# Patient Record
Sex: Female | Born: 1990 | Race: White | Hispanic: No | Marital: Single | Smoking: Never smoker
Health system: Southern US, Community
[De-identification: ages and names within clinical notes are randomized; demographics above are authoritative.]

## PROBLEM LIST (undated history)

## (undated) DIAGNOSIS — M8430XA Stress fracture, unspecified site, initial encounter for fracture: Secondary | ICD-10-CM

## (undated) DIAGNOSIS — T7840XA Allergy, unspecified, initial encounter: Secondary | ICD-10-CM

## (undated) HISTORY — DX: Stress fracture, unspecified site, initial encounter for fracture: M84.30XA

## (undated) HISTORY — DX: Allergy, unspecified, initial encounter: T78.40XA

---
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
## 2011-01-07 ENCOUNTER — Encounter: Payer: Self-pay | Admitting: Internal Medicine

## 2011-01-08 ENCOUNTER — Ambulatory Visit (INDEPENDENT_AMBULATORY_CARE_PROVIDER_SITE_OTHER): Payer: Self-pay | Admitting: Internal Medicine

## 2011-01-08 ENCOUNTER — Encounter: Payer: Self-pay | Admitting: Internal Medicine

## 2011-01-08 VITALS — BP 120/80 | HR 72 | Temp 98.5°F | Wt 136.0 lb

## 2011-01-08 DIAGNOSIS — R0609 Other forms of dyspnea: Secondary | ICD-10-CM

## 2011-01-08 DIAGNOSIS — J45990 Exercise induced bronchospasm: Secondary | ICD-10-CM

## 2011-01-08 DIAGNOSIS — J3089 Other allergic rhinitis: Secondary | ICD-10-CM

## 2011-01-08 DIAGNOSIS — R0600 Dyspnea, unspecified: Secondary | ICD-10-CM

## 2011-01-08 MED ORDER — BECLOMETHASONE DIPROPIONATE 40 MCG/ACT IN AERS: 2.0000 | INHALATION_SPRAY | Freq: Two times a day (BID) | RESPIRATORY_TRACT | Status: DC

## 2011-01-08 NOTE — Patient Instructions (Signed)
Begin q var as discussed  Call in meantime if needed. Will contact you about.  About pulmonary consult. Get chest x ray and will let you know results

## 2011-01-08 NOTE — Progress Notes (Signed)
  Subjective:    Patient ID: Shannon Powers, female    DOB: November 20, 1990, 20 y.o.   MRN: [id number]  HPI Comes in before going off to college for above symptoms . Increasing sx since June ; used inhaler and now more constant.  Is living in [place] . Sometimes light headed after  Exercise breathing  . No cough Hx of allergy Sx around cats.  Eyes red and runny nose and wheeze.  And also the fall. Hx of nebs as a young child. No hx of pneumonia .  hosp for asthma. Currently she describes sx as "Hard to take deep breaths ."   Harder with  Increasing exercising.  No tobacco and no ets.  No nocturnal sx. [place] stuffy nose  Not on meds.   Has to go back to [employer]    Review of Systems No fever vision changes edema GI GU issues new ortho problems no chest pain.  No longer on any OCPS  Or reg allergy meds just albuterol pre exercise  Past Medical History  Diagnosis Date  . Premature baby     [redacted] weeks gestation  . Asthma   . Stress fracture 2009    [place]    No past surgical history on file.  reports that she has never smoked. She does not have any smokeless tobacco history on file. She reports that she drinks alcohol. She reports that she does not use illicit drugs. family history is not on file. No Known Allergies     Objective:   Physical Exam Physical Exam: Vital signs reviewed NWG:NFAO is a well-developed well-nourished alert cooperative  white female who appears her stated age in no acute distress.  HEENT: normocephalic  traumatic , Eyes: PERRL EOM's full, conjunctiva clear, Nares: paten,t no deformity discharge or tenderness., Ears: no deformity EAC's clear TMs with normal landmarks. Mouth: clear OP, no lesions, edema.  Moist mucous membranes. Dentition in adequate repair. NECK: supple without masses, thyromegaly or bruits. CHEST/PULM:  Clear to auscultation and percussion breath sounds equal no wheeze , rales or rhonchi. No chest wall deformities  or tenderness. CV: PMI is nondisplaced, S1 S2 no gallops, murmurs, rubs. Peripheral pulses are full without delay.No JVD .  ABDOMEN: Bowel sounds normal nontender  No guard or rebound, no hepato splenomegal no CVA tenderness.   Extremtities:  No clubbing cyanosis or edema, no acute joint swelling or redness no focal atrophy NEURO:  Oriented x3, cranial nerves 3-12 appear to be intact, no obvious focal weakness,gait within normal limits no abnormal reflexes or asymmetrical SKIN: No acute rashes normal turgor, color, no bruising or petechiae. PSYCH: Oriented, good eye contact, no obvious depression anxiety, cognition and judgment appear normal.     Assessment & Plan:  Dyspnea   Worse with exertion  Hx of eia and allergic sx   No evidence of CV issue. 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   Off to school this week but can come back Get c xray add controller inhaler ( samples of Q Var 40) as discussed and prob need spirometry and eval . To expedite eval  Will  Arrange pulm consult.

## 2011-01-09 ENCOUNTER — Ambulatory Visit (INDEPENDENT_AMBULATORY_CARE_PROVIDER_SITE_OTHER)
Admission: RE | Admit: 2011-01-09 | Discharge: 2011-01-09 | Disposition: A | Discharge status: Home / Self Care | Payer: Self-pay | Source: Ambulatory Visit | Attending: Internal Medicine | Admitting: Internal Medicine

## 2011-01-09 DIAGNOSIS — R0600 Dyspnea, unspecified: Secondary | ICD-10-CM

## 2011-01-09 DIAGNOSIS — R0609 Other forms of dyspnea: Secondary | ICD-10-CM

## 2011-01-09 DIAGNOSIS — J45990 Exercise induced bronchospasm: Secondary | ICD-10-CM

## 2011-01-09 DIAGNOSIS — R0989 Other specified symptoms and signs involving the circulatory and respiratory systems: Secondary | ICD-10-CM

## 2011-01-10 ENCOUNTER — Telehealth: Payer: Self-pay | Admitting: *Deleted

## 2011-01-10 NOTE — Telephone Encounter (Signed)
Message copied by Romualdo Bolk on Thu Jan 10, 2011  9:47 AM ------      Message from: [facility] [place], [place] K      Created: Wed Jan 09, 2011  6:51 PM       Tell patient x ray is normal.

## 2011-01-10 NOTE — Telephone Encounter (Signed)
Left message to call back  

## 2011-01-13 ENCOUNTER — Encounter: Payer: Self-pay | Admitting: Internal Medicine

## 2011-01-14 NOTE — Telephone Encounter (Signed)
Pts mom aware of results

## 2012-06-19 IMAGING — CR DG CHEST 2V
2 series · 2 of 2 positions shown · non-contrast
Comparison: None.

CLINICAL DATA: Shortness of breath with asthma.

CHEST - 2 VIEW

[view not recorded (1 of 2)]
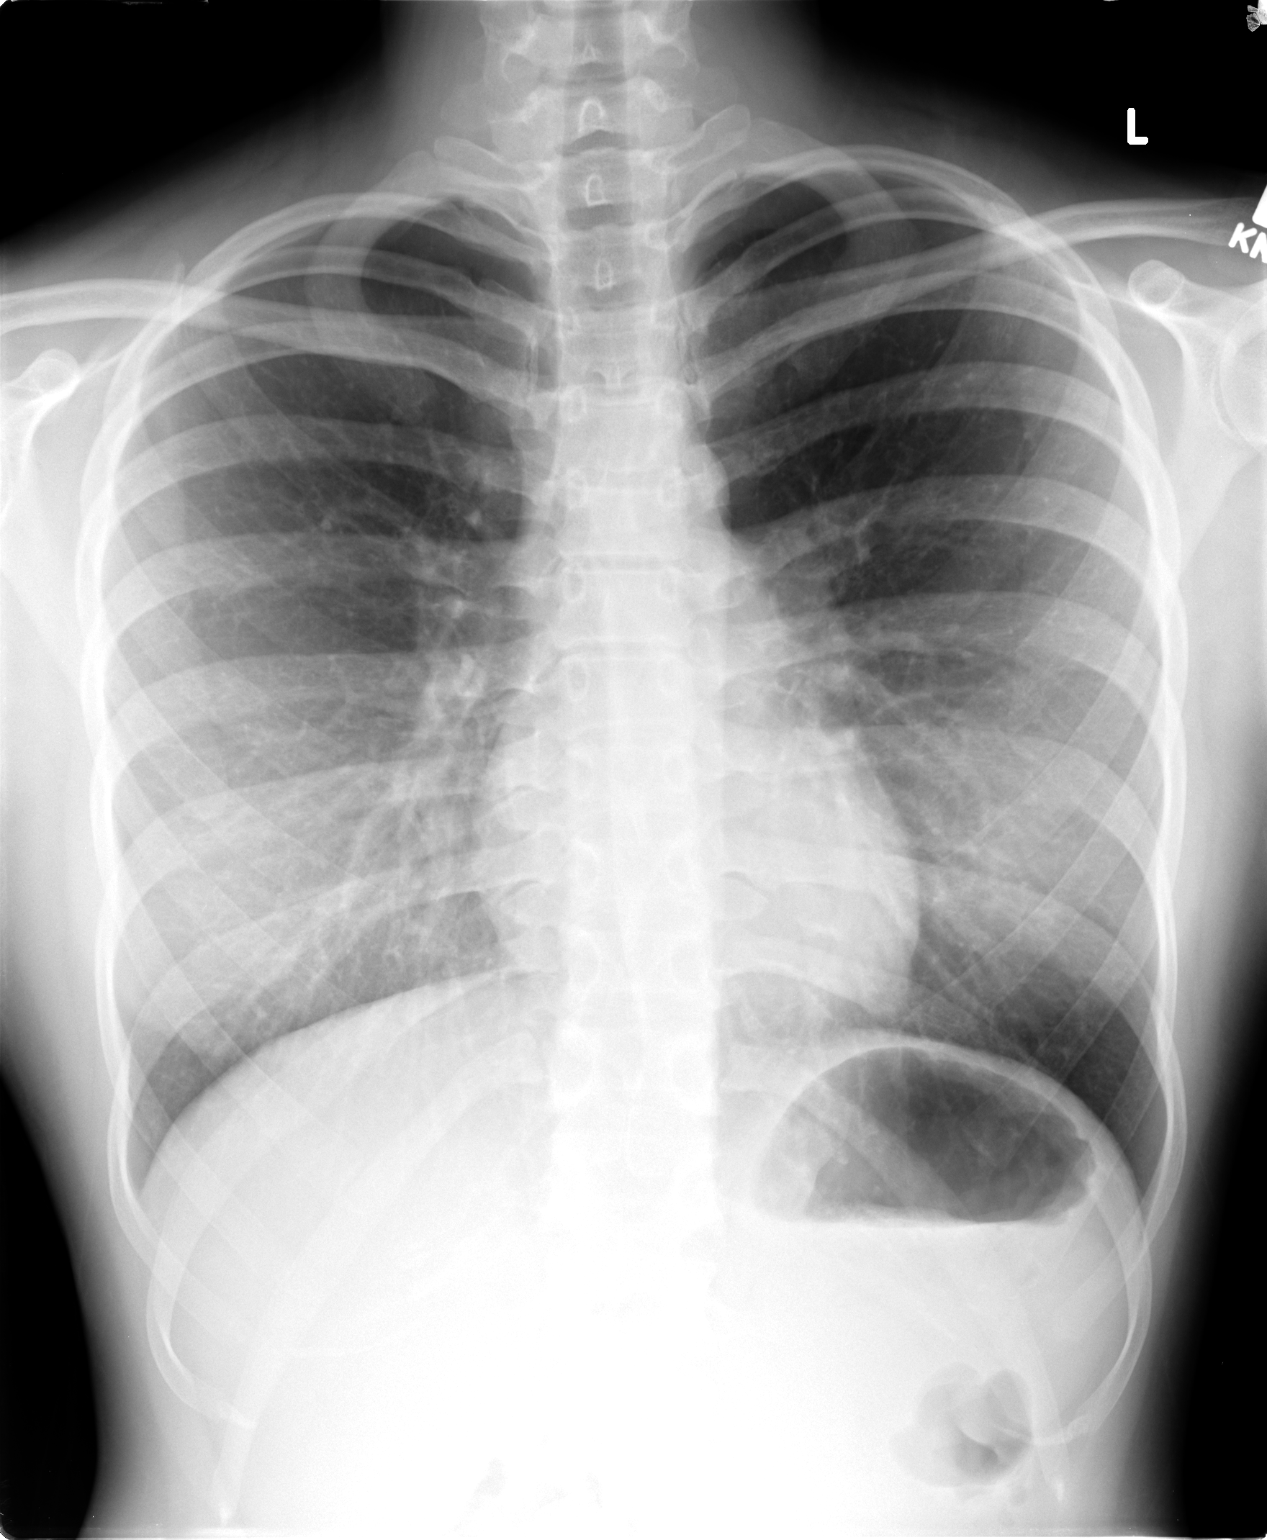

[view not recorded (2 of 2)]
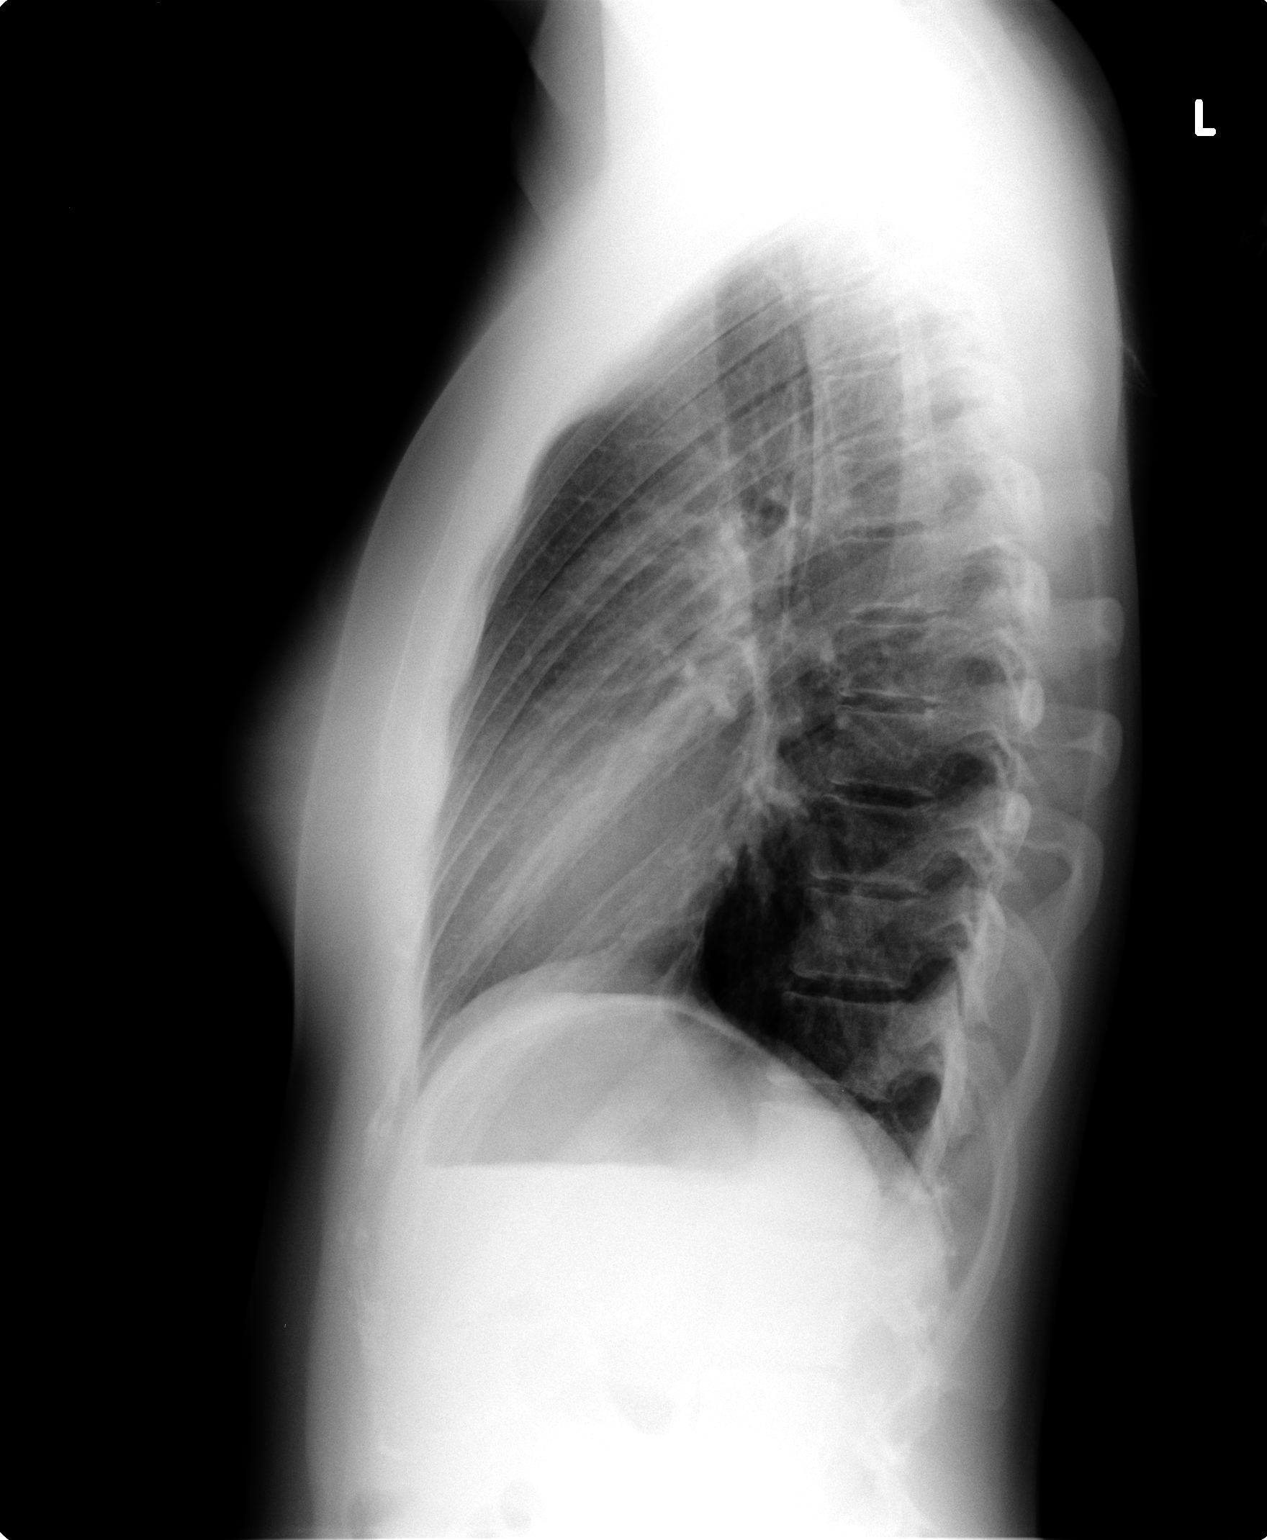

[2 of 2 positions shown; findings below may reference images not displayed]

FINDINGS: The heart size and mediastinal contours are within
normal limits.  Both lungs are clear.  The visualized skeletal
structures are unremarkable.
IMPRESSION: No active cardiopulmonary disease.

## 2013-03-09 ENCOUNTER — Ambulatory Visit (INDEPENDENT_AMBULATORY_CARE_PROVIDER_SITE_OTHER): Payer: BC Managed Care – PPO | Admitting: Internal Medicine

## 2013-03-09 ENCOUNTER — Other Ambulatory Visit (HOSPITAL_COMMUNITY)
Admission: RE | Admit: 2013-03-09 | Discharge: 2013-03-09 | Disposition: A | Discharge status: Home / Self Care | Payer: BC Managed Care – PPO | Source: Ambulatory Visit | Attending: Internal Medicine | Admitting: Internal Medicine

## 2013-03-09 ENCOUNTER — Encounter: Payer: Self-pay | Admitting: Internal Medicine

## 2013-03-09 VITALS — BP 100/76 | Temp 98.1°F | Wt 131.0 lb

## 2013-03-09 DIAGNOSIS — N920 Excessive and frequent menstruation with regular cycle: Secondary | ICD-10-CM

## 2013-03-09 DIAGNOSIS — L819 Disorder of pigmentation, unspecified: Secondary | ICD-10-CM

## 2013-03-09 DIAGNOSIS — Z01419 Encounter for gynecological examination (general) (routine) without abnormal findings: Secondary | ICD-10-CM

## 2013-03-09 DIAGNOSIS — J45990 Exercise induced bronchospasm: Secondary | ICD-10-CM

## 2013-03-09 DIAGNOSIS — N926 Irregular menstruation, unspecified: Secondary | ICD-10-CM

## 2013-03-09 DIAGNOSIS — Z23 Encounter for immunization: Secondary | ICD-10-CM

## 2013-03-09 DIAGNOSIS — Z113 Encounter for screening for infections with a predominantly sexual mode of transmission: Secondary | ICD-10-CM | POA: Insufficient documentation

## 2013-03-09 DIAGNOSIS — N898 Other specified noninflammatory disorders of vagina: Secondary | ICD-10-CM

## 2013-03-09 LAB — CBC WITH DIFFERENTIAL/PLATELET
Basophils Absolute: 0 10*3/uL (ref 0.0–0.1)
Eosinophils Absolute: 0.3 10*3/uL (ref 0.0–0.7)
Eosinophils Relative: 4.4 % (ref 0.0–5.0)
HCT: 42.8 % (ref 36.0–46.0)
Lymphs Abs: 2.2 10*3/uL (ref 0.7–4.0)
MCHC: 33.4 g/dL (ref 30.0–36.0)
MCV: 86.1 fl (ref 78.0–100.0)
Monocytes Absolute: 0.5 10*3/uL (ref 0.1–1.0)
Neutrophils Relative %: 57.9 % (ref 43.0–77.0)
Platelets: 246 10*3/uL (ref 150.0–400.0)
RDW: 13.3 % (ref 11.5–14.6)
WBC: 7.3 10*3/uL (ref 4.5–10.5)

## 2013-03-09 LAB — T4, FREE: Free T4: 0.98 ng/dL (ref 0.60–1.60)

## 2013-03-09 LAB — BASIC METABOLIC PANEL
BUN: 14 mg/dL (ref 6–23)
Chloride: 102 mEq/L (ref 96–112)
Creatinine, Ser: 0.8 mg/dL (ref 0.4–1.2)
Glucose, Bld: 82 mg/dL (ref 70–99)

## 2013-03-09 LAB — TSH: TSH: 1.17 u[IU]/mL (ref 0.35–5.50)

## 2013-03-09 MED ORDER — ALBUTEROL SULFATE HFA 108 (90 BASE) MCG/ACT IN AERS: INHALATION_SPRAY | RESPIRATORY_TRACT | Status: DC

## 2013-03-09 NOTE — Progress Notes (Signed)
Chief Complaint  Patient presents with  . Rash    Also complains of spotting everyday.  Stopped depo provera in May.    HPI: Last ov with me 8 / 2012 over 2 year ago ' Comes in today for a number of reasons ; She has been on depo provera via the [facility]   Pre dep  Periods werer regular  Frequent.  In relationship  And heavy periods.  In  school [employer] .  NOw has Bleeding every day  Dark  Blood  Pads  Or tampons.    No reg period  For a while . Was told to come in but is on fall break so comes here instead  Has never had a pap and is 22 years old. No unusual pain vag dc sti  Skin area on right chest for one month pink and now less  No more  No sig itching or rx   ROS: See pertinent positives and negatives per HPI. Needs rx for eia proventil rarely uses otherwise .  No breast or gallactohhea sx  . No abd pain . Asks for  Refill.  No bleeding otherise uti sx fever   No sa planned  Past Medical History  Diagnosis Date  . Premature baby     [redacted] weeks gestation  . Asthma   . Stress fracture 2009    [place]     No family history on file.  History   Social History  . Marital Status: Single    Spouse Name: N/A    Number of Children: N/A  . Years of Education: N/A   Social History Main Topics  . Smoking status: Never Smoker   . Smokeless tobacco: None  . Alcohol Use: Yes     Comment: occ.  . Drug Use: No  . Sexual Activity: None   Other Topics Concern  . None   Social History Narrative   Negative history of passive tobacco smoke exposure   Not using alcohol   Not using substances of abuse   2 dogs   Household of 5    [facility]     Outpatient Encounter Prescriptions as of 03/09/2013  Medication Sig Dispense Refill  . albuterol (PROVENTIL HFA;VENTOLIN HFA) 108 (90 BASE) MCG/ACT inhaler 2 puffs pre exercise of every 6 hours prn wheezing  3 Inhaler  0  . albuterol (PROVENTIL,VENTOLIN) 90 MCG/ACT inhaler Inhale 2 puffs into the  lungs every 6 (six) hours as needed for wheezing.  51 g  0  . [DISCONTINUED] adapalene (DIFFERIN) 0.1 % gel Apply topically at bedtime.        . [DISCONTINUED] ampicillin (PRINCIPEN) 500 MG capsule Take 500 mg by mouth daily.        . [DISCONTINUED] beclomethasone (QVAR) 40 MCG/ACT inhaler Inhale 2 puffs into the lungs 2 (two) times daily.  1 Inhaler  12  . [DISCONTINUED] Dapsone (ACZONE) 5 % topical gel Apply topically 2 (two) times daily.         No facility-administered encounter medications on file as of 03/09/2013.    EXAM:  BP 100/76  Temp(Src) 98.1 F (36.7 C) (Oral)  Wt 131 lb (59.421 kg)  BMI 21.31 kg/m2  Body mass index is 21.31 kg/(m^2).  GENERAL: vitals reviewed and listed above, alert, oriented, appears well hydrated and in no acute distress HEENT: atraumatic, conjunctiva  clear, no obvious abnormalities on inspection of external nose and ears OP : no lesion edema or  exudate  NECK: no obvious masses on inspection palpation  LUNGS: clear to auscultation bilaterally, no wheezes, rales or rhonchi, good air movement Abdomen:  Sof,t normal bowel sounds without hepatosplenomegaly, no guarding rebound or masses no CVA tenderness Skin basically clear small single 3-4 mm hypopig area round with indistinct edge  Right upper chest CV: HRRR, no clubbing cyanosis or  peripheral edema nl cap refill  MS: moves all extremities without noticeable focal  abnormality Pelvic: NL ext GU, labia clear without lesions or rash . Vagina no lesions .Cervix: clear ? Dark brown in clear mucous in vault and at os no obv lesion or polyp.  UTERUS: Neg CMT Adnexa:  clear no obvious masses  masses . PAP done gc chl screen also  PSYCH: pleasant and cooperative, no obvious depression or anxiety  ASSESSMENT AND PLAN:  Discussed the following assessment and plan:  Spotting - prob fr depo r/o infection etc however continuing consider provera  and gyne check if continuing - Plan: Flu Vaccine QUAD 36+ mos PF  IM (Fluarix), Basic metabolic panel, CBC with Differential, TSH, T4, free, POCT urine pregnancy, Prolactin  Irregular menstrual cycle - prob from depo provera given elsewhere - Plan: Flu Vaccine QUAD 36+ mos PF IM (Fluarix), Basic metabolic panel, CBC with Differential, TSH, T4, free, POCT urine pregnancy, Prolactin  Need for prophylactic vaccination and inoculation against influenza - Plan: Flu Vaccine QUAD 36+ mos PF IM (Fluarix), Basic metabolic panel, CBC with Differential, TSH, T4, free, POCT urine pregnancy, Prolactin  Routine gynecological examination - Plan: Flu Vaccine QUAD 36+ mos PF IM (Fluarix), Basic metabolic panel, CBC with Differential, TSH, T4, free, POCT urine pregnancy, Prolactin, PAP [Myrtle]  EXERCISE INDUCED ASTHMA - stable per hx  wishes refill of b 2 agonist   Uneven pigmentation of skin - small area  subtle ? if early tinea single follow and fu if persisten progress  -Patient advised to return or notify health care team  if symptoms worsen or persist or new concerns arise.  Patient Instructions  This still could be from the depo shot  Will notify you  of labs when available. Then decide on follow up .  Pap pending  We will rule out infection( doubt this )     Burna Mortimer K. Panosh M.D.  Health Maintenance  Topic Date Due  . Tetanus/tdap  08/20/2010  . Influenza Vaccine  12/25/2013  . Pap Smear  03/09/2016   Health Maintenance Review

## 2013-03-09 NOTE — Patient Instructions (Addendum)
This still could be from the depo shot  Will notify you  of labs when available. Then decide on follow up .  Pap pending  We will rule out infection( doubt this )

## 2013-03-11 NOTE — Progress Notes (Signed)
Quick Note:  Tell patient PAP is normal. And lab tests are normal also if spotting is continuing ; Then rx provera 10 Mg 1 po qd for 10 days and then stop . This can cause temporary bloating and then a period .  Track bleeding   And ROV in 3 months or when she can come to visit. Or if spotting doesn't stop ______

## 2013-03-14 ENCOUNTER — Encounter: Payer: Self-pay | Admitting: Internal Medicine

## 2013-03-14 DIAGNOSIS — L819 Disorder of pigmentation, unspecified: Secondary | ICD-10-CM | POA: Insufficient documentation

## 2013-03-17 ENCOUNTER — Telehealth: Payer: Self-pay | Admitting: Internal Medicine

## 2013-03-17 NOTE — Telephone Encounter (Addendum)
Pt would like a call for results of blood work on  [phone].

## 2013-03-18 NOTE — Telephone Encounter (Signed)
See result note.  This note not needed. 

## 2013-03-19 ENCOUNTER — Other Ambulatory Visit: Payer: Self-pay | Admitting: Family Medicine

## 2013-03-19 MED ORDER — MEDROXYPROGESTERONE ACETATE 10 MG PO TABS: 10.0000 mg | ORAL_TABLET | Freq: Every day | ORAL | Status: DC

## 2013-05-24 ENCOUNTER — Other Ambulatory Visit: Payer: Self-pay | Admitting: Internal Medicine

## 2013-05-25 ENCOUNTER — Telehealth: Payer: Self-pay | Admitting: Family Medicine

## 2013-05-25 NOTE — Telephone Encounter (Signed)
lmom for pt to sch appt °

## 2013-05-25 NOTE — Telephone Encounter (Signed)
This patient was advised to follow up in 3 months at last visit (october).  Please call the patient and make an appt. Thanks! Patient's call is [phone]

## 2016-07-30 LAB — HM PAP SMEAR: HM PAP: NORMAL

## 2016-09-02 NOTE — Progress Notes (Signed)
Shannon Powers is a 26 y.o. female here to Establish Care and Annual Physical   I acted as a Neurosurgeon for [employer], PA-C Shannon Mull, LPN  History of Present Illness:   Chief Complaint  Patient presents with  . Establish Care  . Annual Exam    [employer]    Acute Concerns: Lump in L hand -- hit the back of her hand a few weeks ago, lump is persistent, has very slightly improved since it first appeared, slowly going down, no fevers, painful with extension of wrist -- especially if attempting planks or other maneuvers, never injured this hand before, she is R-handed Shaky hands -- when stressed or nervous, or also after exercise she has a slight bilateral tremor, it is only in her hands, has noticed this since high school, does have a history of being vegetarian for 3-4 years, did not take MVI consistently, unsure if current MVI has B12; she is unsure if this is resolved with ETOH intake; denies slurred speech, headaches, unilateral or bilateral weakness, blurred vision  Chronic Issues: Exercise induced asthma -- she has an inhaler but it is not used unless around cats or prior to vigorous exercise  Health Maintenance: Immunizations -- Tdap due Mammogram -- no early ultrasounds for concerning breast lumps PAP -- no abnormal pap smear Bone Density -- none Diet -- mostly vegetarian Caffeine intake -- 1 cup of coffee or tea daily; no energy drinks or sodas Sleep habits -- 7-8 hours, pretty good at staying asleep Exercise -- run or walk; body pump Weight -- Weight: 133 lb 6.1 oz (60.5 kg) --- 133 lb is nornal Mood -- no issues with depression or anxiety Contraception -- Nuvaring -- has been using for a year and a half Periods -- cramps and heavy bleeding only on first day, otherwise well controlled  Depression screen PHQ 2/9 09/03/2016  Decreased Interest 0  Down, Depressed, Hopeless 0  PHQ - 2 Score 0    Other providers/specialists: Dr. Juliene Powers -- gynecologist  PMHx, SurgHx,  SocialHx, Medications, and Allergies were reviewed in the Visit Navigator and updated as appropriate.  Current Medications:   Current Outpatient Prescriptions:  .  albuterol (PROAIR HFA) 108 (90 Base) MCG/ACT inhaler, Inhale 2 puffs into the lungs every 6 (six) hours as needed for wheezing or shortness of breath., Disp: 3 each, Rfl: 0 .  cetirizine (ZYRTEC) 10 MG tablet, Take 10 mg by mouth daily., Disp: , Rfl:  .  NUVARING 0.12-0.015 MG/24HR vaginal ring, , Disp: , Rfl:  .  propranolol (INDERAL) 20 MG tablet, Take 1 tablet (20 mg total) by mouth daily as needed. For tremor, Disp: 30 tablet, Rfl: 1   Review of Systems:   Review of Systems  Constitutional: Negative.   HENT: Negative.   Eyes: Negative.   Respiratory: Negative.   Cardiovascular: Negative.   Gastrointestinal: Negative.   Genitourinary: Negative.   Musculoskeletal:       Check knot left hand  Skin: Negative.   Neurological: Positive for tremors.       Hands shake several years, worse past 6 months  Endo/Heme/Allergies: Positive for environmental allergies.  Psychiatric/Behavioral: Negative.     Vitals:   Vitals:   09/03/16 0941  BP: 110/68  Pulse: 83  Temp: 97.9 F (36.6 C)  TempSrc: Oral  SpO2: 100%  Weight: 133 lb 6.1 oz (60.5 kg)  Height:  (1.651 m)     Body mass index is 22.2 kg/m.  Physical Exam:  Physical Exam  Constitutional: She appears well-developed and well-nourished. She is cooperative.  Non-toxic appearance. She does not have a sickly appearance. She does not appear ill. No distress.  HENT:  Head: Normocephalic and atraumatic.  Right Ear: Tympanic membrane, external ear and ear canal normal. Tympanic membrane is not erythematous, not retracted and not bulging.  Left Ear: Tympanic membrane, external ear and ear canal normal. Tympanic membrane is not erythematous, not retracted and not bulging.  Eyes: Conjunctivae, EOM and lids are normal. Pupils are equal, round, and reactive to  light.  Neck: Trachea normal and full passive range of motion without pain.  Cardiovascular: Normal rate, regular rhythm, S1 normal, S2 normal, normal heart sounds and intact distal pulses.   No LE swelling  Pulmonary/Chest: Effort normal and breath sounds normal. No tachypnea. No respiratory distress. She has no decreased breath sounds. She has no wheezes. She has no rhonchi. She has no rales.  Abdominal: Soft. Normal appearance and bowel sounds are normal. There is no tenderness.  Genitourinary: Vagina normal. Cervix exhibits no motion tenderness, no discharge and no friability. Right adnexum displays no mass and no tenderness. Left adnexum displays no mass and no tenderness. No erythema, tenderness or bleeding in the vagina. No foreign body in the vagina. No vaginal discharge found.  Genitourinary Comments: Deferred, goes to GYN  Musculoskeletal: Normal range of motion.  Small raised area of skin to dorsum of hand, no ecchymosis or erythema, no tenderness to palpation  Lymphadenopathy:    She has no cervical adenopathy.  Neurological: She is alert. She has normal reflexes. No cranial nerve deficit or sensory deficit. GCS eye subscore is 4. GCS verbal subscore is 5. GCS motor subscore is 6.  Very slight shaking of hands when still  Skin: Skin is warm, dry and intact.  Psychiatric: She has a normal mood and affect. Her speech is normal and behavior is normal.  Nursing note and vitals reviewed.     Assessment and Plan:    Shannon Powers was seen today for establish care and annual exam.  Diagnoses and all orders for this visit:  Encounter for general adult medical examination with abnormal findings Today patient counseled on age appropriate routine health concerns for screening and prevention, each reviewed and up to date or declined. Immunizations reviewed and up to date or declined. Labs ordered and reviewed. Risk factors for depression reviewed and negative. Hearing function and visual acuity  are intact. ADLs screened and addressed as needed. Functional ability and level of safety reviewed and appropriate. Education, counseling and referrals performed based on assessed risks today. Patient provided with a copy of personalized plan for preventive services. She is not fasting and will return to for fasting labs. She is agreeable to 1 time HIV screening today. -     CBC with Differential/Platelet; Future -     Comprehensive metabolic panel; Future -     Lipid panel; Future -     HIV antibody; Future  Need for prophylactic vaccination with combined diphtheria-tetanus-pertussis (DTP) vaccine -     Tdap vaccine greater than or equal to 7yo IM  Tremor Currently asymptomatic. Will order routine labs. I prescribed Inderal for prn use if needed. I would like for her to follow up in 1 month to re-assess this and determine if further work-up is needed.  -     CBC with Differential/Platelet; Future -     Comprehensive metabolic panel; Future -     Magnesium; Future -  T4, free; Future -     TSH; Future -     Vitamin B12; Future -     VITAMIN D 25 Hydroxy (Vit-D Deficiency, Fractures); Future  Injury of left hand, initial encounter Xray today to assess for any acute abnormalities. Consider appointment with Dr. Gaspar Bidding. -     DG Hand Complete Left; Future  Exercise-induced bronchospasm Very well controlled. Refill proair.  Other orders -     propranolol (INDERAL) 20 MG tablet; Take 1 tablet (20 mg total) by mouth daily as needed. For tremor -     albuterol (PROAIR HFA) 108 (90 Base) MCG/ACT inhaler; Inhale 2 puffs into the lungs every 6 (six) hours as needed for wheezing or shortness of breath.   . Reviewed expectations re: course of current medical issues. . Discussed self-management of symptoms. . Outlined signs and symptoms indicating need for more acute intervention. . Patient verbalized understanding and all questions were answered. . See orders for this visit as  documented in the electronic medical record. . Patient received an After-Visit Summary.  CMA or LPN served as scribe during this visit. History, Physical, and Plan performed by medical provider. Documentation and orders reviewed and attested to.  Jarold Motto, PA-C

## 2016-09-02 NOTE — Progress Notes (Signed)
Pre visit review using our clinic review tool, if applicable. No additional management support is needed unless otherwise documented below in the visit note. 

## 2016-09-03 ENCOUNTER — Encounter: Payer: Self-pay | Admitting: Physician Assistant

## 2016-09-03 ENCOUNTER — Ambulatory Visit (INDEPENDENT_AMBULATORY_CARE_PROVIDER_SITE_OTHER): Payer: 59 | Admitting: Physician Assistant

## 2016-09-03 ENCOUNTER — Ambulatory Visit (INDEPENDENT_AMBULATORY_CARE_PROVIDER_SITE_OTHER): Payer: 59

## 2016-09-03 VITALS — BP 110/68 | HR 83 | Temp 97.9°F | Ht 65.0 in | Wt 133.4 lb

## 2016-09-03 DIAGNOSIS — J45990 Exercise induced bronchospasm: Secondary | ICD-10-CM

## 2016-09-03 DIAGNOSIS — R251 Tremor, unspecified: Secondary | ICD-10-CM

## 2016-09-03 DIAGNOSIS — Z23 Encounter for immunization: Secondary | ICD-10-CM

## 2016-09-03 DIAGNOSIS — S6992XA Unspecified injury of left wrist, hand and finger(s), initial encounter: Secondary | ICD-10-CM

## 2016-09-03 DIAGNOSIS — Z0001 Encounter for general adult medical examination with abnormal findings: Secondary | ICD-10-CM | POA: Diagnosis not present

## 2016-09-03 MED ORDER — ALBUTEROL SULFATE HFA 108 (90 BASE) MCG/ACT IN AERS: 2.0000 | INHALATION_SPRAY | Freq: Four times a day (QID) | RESPIRATORY_TRACT | 0 refills | Status: DC | PRN

## 2016-09-03 MED ORDER — PROPRANOLOL HCL 20 MG PO TABS: 20.0000 mg | ORAL_TABLET | Freq: Every day | ORAL | 1 refills | Status: AC | PRN

## 2016-09-03 NOTE — Patient Instructions (Addendum)
It was great meeting you today!  Please go to the lab to have your xray performed.   Make sure you are taking a multivitamin with B12.  Take propranolol as needed for tremor. Follow-up in 1 month for this.  Please make an appointment with the lab on your way out. I would like for you to return for lab work within 1 week. After midnight on the day of the lab draw, please do not eat anything. You may have water, black coffee, unsweetened tea.  Health Maintenance, Female Adopting a healthy lifestyle and getting preventive care can go a long way to promote health and wellness. Talk with your health care provider about what schedule of regular examinations is right for you. This is a good chance for you to check in with your provider about disease prevention and staying healthy. In between checkups, there are plenty of things you can do on your own. Experts have done a lot of research about which lifestyle changes and preventive measures are most likely to keep you healthy. Ask your health care provider for more information. Weight and diet Eat a healthy diet  Be sure to include plenty of vegetables, fruits, low-fat dairy products, and lean protein.  Do not eat a lot of foods high in solid fats, added sugars, or salt.  Get regular exercise. This is one of the most important things you can do for your health.  Most adults should exercise for at least 150 minutes each week. The exercise should increase your heart rate and make you sweat (moderate-intensity exercise).  Most adults should also do strengthening exercises at least twice a week. This is in addition to the moderate-intensity exercise. Maintain a healthy weight  Body mass index (BMI) is a measurement that can be used to identify possible weight problems. It estimates body fat based on height and weight. Your health care provider can help determine your BMI and help you achieve or maintain a healthy weight.  For females 83 years of age  and older:  A BMI below 18.5 is considered underweight.  A BMI of 18.5 to 24.9 is normal.  A BMI of 25 to 29.9 is considered overweight.  A BMI of 30 and above is considered obese. Watch levels of cholesterol and blood lipids  You should start having your blood tested for lipids and cholesterol at 25 years of age, then have this test every 5 years.  You may need to have your cholesterol levels checked more often if:  Your lipid or cholesterol levels are high.  You are older than 25 years of age.  You are at high risk for heart disease. Cancer screening Lung Cancer  Lung cancer screening is recommended for adults 9-23 years old who are at high risk for lung cancer because of a history of smoking.  A yearly low-dose CT scan of the lungs is recommended for people who:  Currently smoke.  Have quit within the past 15 years.  Have at least a 30-pack-year history of smoking. A pack year is smoking an average of one pack of cigarettes a day for 1 year.  Yearly screening should continue until it has been 15 years since you quit.  Yearly screening should stop if you develop a health problem that would prevent you from having lung cancer treatment. Breast Cancer  Practice breast self-awareness. This means understanding how your breasts normally appear and feel.  It also means doing regular breast self-exams. Let your health care provider know about  any changes, no matter how small.  If you are in your 20s or 30s, you should have a clinical breast exam (CBE) by a health care provider every 1-3 years as part of a regular health exam.  If you are 30 or older, have a CBE every year. Also consider having a breast X-ray (mammogram) every year.  If you have a family history of breast cancer, talk to your health care provider about genetic screening.  If you are at high risk for breast cancer, talk to your health care provider about having an MRI and a mammogram every year.  Breast  cancer gene (BRCA) assessment is recommended for women who have family members with BRCA-related cancers. BRCA-related cancers include:  Breast.  Ovarian.  Tubal.  Peritoneal cancers.  Results of the assessment will determine the need for genetic counseling and BRCA1 and BRCA2 testing. Cervical Cancer  Your health care provider may recommend that you be screened regularly for cancer of the pelvic organs (ovaries, uterus, and vagina). This screening involves a pelvic examination, including checking for microscopic changes to the surface of your cervix (Pap test). You may be encouraged to have this screening done every 3 years, beginning at age 55.  For women ages 22-65, health care providers may recommend pelvic exams and Pap testing every 3 years, or they may recommend the Pap and pelvic exam, combined with testing for human papilloma virus (HPV), every 5 years. Some types of HPV increase your risk of cervical cancer. Testing for HPV may also be done on women of any age with unclear Pap test results.  Other health care providers may not recommend any screening for nonpregnant women who are considered low risk for pelvic cancer and who do not have symptoms. Ask your health care provider if a screening pelvic exam is right for you.  If you have had past treatment for cervical cancer or a condition that could lead to cancer, you need Pap tests and screening for cancer for at least 20 years after your treatment. If Pap tests have been discontinued, your risk factors (such as having a new sexual partner) need to be reassessed to determine if screening should resume. Some women have medical problems that increase the chance of getting cervical cancer. In these cases, your health care provider may recommend more frequent screening and Pap tests. Colorectal Cancer  This type of cancer can be detected and often prevented.  Routine colorectal cancer screening usually begins at 25 years of age and  continues through 25 years of age.  Your health care provider may recommend screening at an earlier age if you have risk factors for colon cancer.  Your health care provider may also recommend using home test kits to check for hidden blood in the stool.  A small camera at the end of a tube can be used to examine your colon directly (sigmoidoscopy or colonoscopy). This is done to check for the earliest forms of colorectal cancer.  Routine screening usually begins at age 18.  Direct examination of the colon should be repeated every 5-10 years through 25 years of age. However, you may need to be screened more often if early forms of precancerous polyps or small growths are found. Skin Cancer  Check your skin from head to toe regularly.  Tell your health care provider about any new moles or changes in moles, especially if there is a change in a mole's shape or color.  Also tell your health care provider if you  have a mole that is larger than the size of a pencil eraser.  Always use sunscreen. Apply sunscreen liberally and repeatedly throughout the day.  Protect yourself by wearing long sleeves, pants, a wide-brimmed hat, and sunglasses whenever you are outside. Heart disease, diabetes, and high blood pressure  High blood pressure causes heart disease and increases the risk of stroke. High blood pressure is more likely to develop in:  People who have blood pressure in the high end of the normal range (130-139/85-89 mm Hg).  People who are overweight or obese.  People who are African American.  If you are 4-59 years of age, have your blood pressure checked every 3-5 years. If you are 15 years of age or older, have your blood pressure checked every year. You should have your blood pressure measured twice-once when you are at a hospital or clinic, and once when you are not at a hospital or clinic. Record the average of the two measurements. To check your blood pressure when you are not at a  hospital or clinic, you can use:  An automated blood pressure machine at a pharmacy.  A home blood pressure monitor.  If you are between 55 years and 33 years old, ask your health care provider if you should take aspirin to prevent strokes.  Have regular diabetes screenings. This involves taking a blood sample to check your fasting blood sugar level.  If you are at a normal weight and have a low risk for diabetes, have this test once every three years after 25 years of age.  If you are overweight and have a high risk for diabetes, consider being tested at a younger age or more often. Preventing infection Hepatitis B  If you have a higher risk for hepatitis B, you should be screened for this virus. You are considered at high risk for hepatitis B if:  You were born in a country where hepatitis B is common. Ask your health care provider which countries are considered high risk.  Your parents were born in a high-risk country, and you have not been immunized against hepatitis B (hepatitis B vaccine).  You have HIV or AIDS.  You use needles to inject street drugs.  You live with someone who has hepatitis B.  You have had sex with someone who has hepatitis B.  You get hemodialysis treatment.  You take certain medicines for conditions, including cancer, organ transplantation, and autoimmune conditions. Hepatitis C  Blood testing is recommended for:  Everyone born from [birth year] through 1964.  Anyone with known risk factors for hepatitis C. Sexually transmitted infections (STIs)  You should be screened for sexually transmitted infections (STIs) including gonorrhea and chlamydia if:  You are sexually active and are younger than 24 years of age.  You are older than 25 years of age and your health care provider tells you that you are at risk for this type of infection.  Your sexual activity has changed since you were last screened and you are at an increased risk for chlamydia or  gonorrhea. Ask your health care provider if you are at risk.  If you do not have HIV, but are at risk, it may be recommended that you take a prescription medicine daily to prevent HIV infection. This is called pre-exposure prophylaxis (PrEP). You are considered at risk if:  You are sexually active and do not regularly use condoms or know the HIV status of your partner(s).  You take drugs by injection.  You are  sexually active with a partner who has HIV. Talk with your health care provider about whether you are at high risk of being infected with HIV. If you choose to begin PrEP, you should first be tested for HIV. You should then be tested every 3 months for as long as you are taking PrEP. Pregnancy  If you are premenopausal and you may become pregnant, ask your health care provider about preconception counseling.  If you may become pregnant, take 400 to 800 micrograms (mcg) of folic acid every day.  If you want to prevent pregnancy, talk to your health care provider about birth control (contraception). Osteoporosis and menopause  Osteoporosis is a disease in which the bones lose minerals and strength with aging. This can result in serious bone fractures. Your risk for osteoporosis can be identified using a bone density scan.  If you are 34 years of age or older, or if you are at risk for osteoporosis and fractures, ask your health care provider if you should be screened.  Ask your health care provider whether you should take a calcium or vitamin D supplement to lower your risk for osteoporosis.  Menopause may have certain physical symptoms and risks.  Hormone replacement therapy may reduce some of these symptoms and risks. Talk to your health care provider about whether hormone replacement therapy is right for you. Follow these instructions at home:  Schedule regular health, dental, and eye exams.  Stay current with your immunizations.  Do not use any tobacco products including  cigarettes, chewing tobacco, or electronic cigarettes.  If you are pregnant, do not drink alcohol.  If you are breastfeeding, limit how much and how often you drink alcohol.  Limit alcohol intake to no more than 1 drink per day for nonpregnant women. One drink equals 12 ounces of beer, 5 ounces of wine, or 1 ounces of hard liquor.  Do not use street drugs.  Do not share needles.  Ask your health care provider for help if you need support or information about quitting drugs.  Tell your health care provider if you often feel depressed.  Tell your health care provider if you have ever been abused or do not feel safe at home. This information is not intended to replace advice given to you by your health care provider. Make sure you discuss any questions you have with your health care provider. Document Released: 11/26/2010 Document Revised: 10/19/2015 Document Reviewed: 02/14/2015 Elsevier Interactive Patient Education  2017 [employer].

## 2016-09-04 ENCOUNTER — Other Ambulatory Visit (INDEPENDENT_AMBULATORY_CARE_PROVIDER_SITE_OTHER): Payer: 59

## 2016-09-04 ENCOUNTER — Encounter: Payer: Self-pay | Admitting: Physician Assistant

## 2016-09-04 DIAGNOSIS — R251 Tremor, unspecified: Secondary | ICD-10-CM

## 2016-09-04 DIAGNOSIS — Z0001 Encounter for general adult medical examination with abnormal findings: Secondary | ICD-10-CM

## 2016-09-04 LAB — LIPID PANEL
CHOL/HDL RATIO: 3
CHOLESTEROL: 176 mg/dL (ref 0–200)
HDL: 67.7 mg/dL (ref >39.00)
LDL CALC: 82 mg/dL (ref 0–99)
NonHDL: 108.41
TRIGLYCERIDES: 132 mg/dL (ref 0.0–149.0)
VLDL: 26.4 mg/dL (ref 0.0–40.0)

## 2016-09-04 LAB — COMPREHENSIVE METABOLIC PANEL
ALBUMIN: 4 g/dL (ref 3.5–5.2)
ALT: 12 U/L (ref 0–35)
AST: 19 U/L (ref 0–37)
Alkaline Phosphatase: 45 U/L (ref 39–117)
BUN: 17 mg/dL (ref 6–23)
CHLORIDE: 103 meq/L (ref 96–112)
CO2: 26 meq/L (ref 19–32)
CREATININE: 0.97 mg/dL (ref 0.40–1.20)
Calcium: 9.3 mg/dL (ref 8.4–10.5)
GFR: 74.34 mL/min (ref >60.00)
Glucose, Bld: 88 mg/dL (ref 70–99)
POTASSIUM: 3.5 meq/L (ref 3.5–5.1)
SODIUM: 138 meq/L (ref 135–145)
Total Bilirubin: 0.7 mg/dL (ref 0.2–1.2)
Total Protein: 7.1 g/dL (ref 6.0–8.3)

## 2016-09-04 LAB — CBC WITH DIFFERENTIAL/PLATELET
BASOS PCT: 1 % (ref 0.0–3.0)
Basophils Absolute: 0.1 10*3/uL (ref 0.0–0.1)
EOS ABS: 0.3 10*3/uL (ref 0.0–0.7)
EOS PCT: 4.6 % (ref 0.0–5.0)
HCT: 41.1 % (ref 36.0–46.0)
HEMOGLOBIN: 13.5 g/dL (ref 12.0–15.0)
Lymphocytes Relative: 41.2 % (ref 12.0–46.0)
Lymphs Abs: 2.9 10*3/uL (ref 0.7–4.0)
MCHC: 32.9 g/dL (ref 30.0–36.0)
MCV: 82.7 fl (ref 78.0–100.0)
MONO ABS: 0.4 10*3/uL (ref 0.1–1.0)
Monocytes Relative: 6.1 % (ref 3.0–12.0)
NEUTROS ABS: 3.3 10*3/uL (ref 1.4–7.7)
Neutrophils Relative %: 47.1 % (ref 43.0–77.0)
PLATELETS: 327 10*3/uL (ref 150.0–400.0)
RBC: 4.97 Mil/uL (ref 3.87–5.11)
RDW: 15.3 % (ref 11.5–15.5)
WBC: 6.9 10*3/uL (ref 4.0–10.5)

## 2016-09-04 LAB — TSH: TSH: 3.21 u[IU]/mL (ref 0.35–4.50)

## 2016-09-04 LAB — VITAMIN B12: Vitamin B-12: 253 pg/mL (ref 211–911)

## 2016-09-04 LAB — MAGNESIUM: MAGNESIUM: 2 mg/dL (ref 1.5–2.5)

## 2016-09-04 LAB — CHLAMYDIA/GC NAA, CONFIRMATION
Chlamydia by NAA: NEGATIVE
Neisseria gonorrhoeae, NAA: NEGATIVE

## 2016-09-04 LAB — VITAMIN D 25 HYDROXY (VIT D DEFICIENCY, FRACTURES): VITD: 50.44 ng/mL (ref 30.00–100.00)

## 2016-09-04 LAB — T4, FREE: Free T4: 1.02 ng/dL (ref 0.60–1.60)

## 2016-09-05 LAB — HIV ANTIBODY (ROUTINE TESTING W REFLEX): HIV 1&2 Ab, 4th Generation: NONREACTIVE

## 2016-10-03 ENCOUNTER — Ambulatory Visit: Payer: 59 | Admitting: Physician Assistant

## 2016-10-17 ENCOUNTER — Ambulatory Visit: Payer: 59 | Admitting: Physician Assistant

## 2017-05-22 ENCOUNTER — Other Ambulatory Visit: Payer: Self-pay | Admitting: Surgical

## 2017-05-23 MED ORDER — ALBUTEROL SULFATE HFA 108 (90 BASE) MCG/ACT IN AERS: 2.0000 | INHALATION_SPRAY | Freq: Four times a day (QID) | RESPIRATORY_TRACT | 0 refills | Status: DC | PRN

## 2017-05-26 ENCOUNTER — Other Ambulatory Visit: Payer: Self-pay

## 2017-05-26 MED ORDER — ALBUTEROL SULFATE HFA 108 (90 BASE) MCG/ACT IN AERS: 2.0000 | INHALATION_SPRAY | Freq: Four times a day (QID) | RESPIRATORY_TRACT | 0 refills | Status: DC | PRN

## 2017-07-30 ENCOUNTER — Other Ambulatory Visit: Payer: Self-pay | Admitting: Family Medicine

## 2017-08-12 ENCOUNTER — Telehealth: Payer: Self-pay | Admitting: *Deleted

## 2017-08-12 NOTE — Telephone Encounter (Signed)
Left message on personal voicemail calling to see if you had your Flu shot you can call us back or send a My Chart message. Also just to remind you that you are do for physical in April.

## 2018-09-07 ENCOUNTER — Telehealth: Payer: Self-pay | Admitting: *Deleted

## 2018-09-07 NOTE — Telephone Encounter (Signed)
Left message on voicemail to call office. Needs to schedule follow up for Anxiety and Asthma with Lelon Mast, last seen 2018.

## 2018-09-09 NOTE — Telephone Encounter (Signed)
Left message on voicemail to call office.
